# Patient Record
Sex: Female | Born: 1990 | Race: White | Hispanic: No | Marital: Single | Smoking: Never smoker
Health system: Southern US, Community
[De-identification: ages and names within clinical notes are randomized; demographics above are authoritative.]

---

## 2010-11-11 ENCOUNTER — Ambulatory Visit: Payer: Self-pay | Admitting: Family

## 2010-11-18 ENCOUNTER — Ambulatory Visit: Payer: Self-pay | Admitting: Family

## 2010-11-18 ENCOUNTER — Other Ambulatory Visit
Admission: RE | Admit: 2010-11-18 | Discharge: 2010-11-18 | Payer: Self-pay | Source: Home / Self Care | Admitting: Internal Medicine

## 2010-11-18 ENCOUNTER — Ambulatory Visit (HOSPITAL_BASED_OUTPATIENT_CLINIC_OR_DEPARTMENT_OTHER)
Admission: RE | Admit: 2010-11-18 | Discharge: 2010-11-18 | Payer: Self-pay | Source: Home / Self Care | Attending: Internal Medicine | Admitting: Internal Medicine

## 2010-11-18 ENCOUNTER — Encounter: Payer: Self-pay | Admitting: Family

## 2010-11-18 DIAGNOSIS — R112 Nausea with vomiting, unspecified: Secondary | ICD-10-CM

## 2010-11-18 DIAGNOSIS — K219 Gastro-esophageal reflux disease without esophagitis: Secondary | ICD-10-CM

## 2010-11-18 LAB — CONVERTED CEMR LAB
ALT: 14 units/L (ref 0–35)
AST: 14 units/L (ref 0–37)
BUN: 12 mg/dL (ref 6–23)
Bilirubin, Direct: 0.1 mg/dL (ref 0.0–0.3)
Calcium: 9.7 mg/dL (ref 8.4–10.5)
Chloride: 105 meq/L (ref 96–112)
Hemoglobin: 14.1 g/dL (ref 12.0–15.0)
Indirect Bilirubin: 0.6 mg/dL (ref 0.0–0.9)
Lymphocytes Relative: 37 % (ref 12–46)
Lymphs Abs: 1.6 10*3/uL (ref 0.7–4.0)
MCHC: 33.1 g/dL (ref 30.0–36.0)
Monocytes Absolute: 0.5 10*3/uL (ref 0.1–1.0)
Neutro Abs: 2.1 10*3/uL (ref 1.7–7.7)
Platelets: 241 10*3/uL (ref 150–400)
RBC: 4.52 M/uL (ref 3.87–5.11)
RDW: 12.4 % (ref 11.5–15.5)
TSH: 0.559 microintl units/mL (ref 0.350–4.500)
Total Bilirubin: 0.7 mg/dL (ref 0.3–1.2)
Total Protein: 7.1 g/dL (ref 6.0–8.3)

## 2010-11-19 ENCOUNTER — Encounter: Payer: Self-pay | Admitting: Family

## 2010-11-21 ENCOUNTER — Encounter: Payer: Self-pay | Admitting: Family

## 2010-12-11 ENCOUNTER — Telehealth: Payer: Self-pay | Admitting: Family

## 2010-12-25 NOTE — Assessment & Plan Note (Signed)
Summary: new to est uhc/mhf--Rm 5   Vital Signs:  Patient profile:   20 year old female Menstrual status:  regular LMP:     11/11/2010 Height:      66.5 inches Weight:      174 pounds BMI:     27.76 Temp:     98.0 degrees F oral Pulse rate:   72 / minute Pulse rhythm:   regular Resp:     16 per minute BP sitting:   122 / 86  (right arm) Cuff size:   large  Vitals Entered By: Mervin Kung CMA Duncan Dull) (November 11, 2010 8:26 AM) CC: New pt to establish care. Needs refill on Sprintec. Is Patient Diabetic? No Pain Assessment Patient in pain? no      LMP (date): 11/11/2010     Menstrual Status regular Enter LMP: 11/11/2010   CC:  New pt to establish care. Needs refill on Sprintec.Marland Kitchen  History of Present Illness: Pt.  has been following with [facility] in [place].    Has not had a pap smear.  Jogs several times a week.  Diet-  fair.  Doing own.    Preventive Screening-Counseling & Management  Alcohol-Tobacco     Alcohol drinks/day: <1     Smoking Status: never  Caffeine-Diet-Exercise     Caffeine use/day: 1 daily     Does Patient Exercise: yes     Type of exercise: jogging     Times/week: 2  Allergies (verified): 1)  ! Sulfa  Past History:  Past Medical History: none  Past Surgical History: none  Family History: mother-- living, ? arrythmia father-- living, diabetes2 1 sister-- lactose intolerant No children.  Social History: Occupation: Consulting civil engineer -  [employer] 1 sexual partner last 6 months Single Never Smoked Alcohol use-yes (2-3 drinks a week) Denies drug use Regular exercise-yes Smoking Status:  never Caffeine use/day:  1 daily Does Patient Exercise:  yes  Review of Systems       Constitutional: Denies Fever ENT:  Denies nasal congestion or sore throat. Resp: Denies cough CV:  Denies Chest Pain GI:  Denies nausea or vomitting GU: Denies dysuria Lymphatic: Denies lymphadenopathy Musculoskeletal:  Denies muscle/joint  pain Skin:  Denies Rashes Psychiatric: Denies depression Neuro: Denies numbness     Physical Exam  General:  Well-developed,well-nourished,in no acute distress; alert,appropriate and cooperative throughout examination Head:  Normocephalic and atraumatic without obvious abnormalities. No apparent alopecia or balding. Eyes:  PERRLA Ears:  External ear exam shows no significant lesions or deformities.  Otoscopic examination reveals clear canals, tympanic membranes are intact bilaterally without bulging, retraction, inflammation or discharge. Hearing is grossly normal bilaterally. Mouth:  Oral mucosa and oropharynx without lesions or exudates.  Teeth in good repair. Neck:  No deformities, masses, or tenderness noted. Breasts:  deferred Lungs:  Normal respiratory effort, chest expands symmetrically. Lungs are clear to auscultation, no crackles or wheezes. Heart:  Normal rate and regular rhythm. S1 and S2 normal without gallop, murmur, click, rub or other extra sounds. Abdomen:  Bowel sounds positive,abdomen soft and non-tender without masses, organomegaly or hernias noted. + "belly button" ring Genitalia:  deferred Msk:  No deformity or scoliosis noted of thoracic or lumbar spine.   Pulses:  2+ bilateral radial, DP/PT pulses Extremities:  No clubbing, cyanosis, edema, or deformity noted with normal full range of motion of all joints.   Neurologic:  No cranial nerve deficits noted. Station and gait are normal. Plantar reflexes are down-going bilaterally. DTRs are symmetrical  throughout. Sensory, motor and coordinative functions appear intact. Skin:  Intact without suspicious lesions or rashes Cervical Nodes:  No lymphadenopathy noted Psych:  Cognition and judgment appear intact. Alert and cooperative with normal attention span and concentration. No apparent delusions, illusions, hallucinations   Impression & Recommendations:  Problem # 1:  Preventive Health Care (ICD-V70.0) Assessment  Comment Only Patient counseled on diet, exercise, weight loss and safe sex.  + menses today, pt to f/u in January for pap smear. Wishes to discuss immunizations with her mother.  We will plan to give at f/u visit.   Complete Medication List: 1)  Sprintec 28 0.25-35 Mg-mcg Tabs (Norgestimate-eth estradiol) .... Take 1 tablet by mouth once a day. 2)  Ibuprofen 600 Mg Tabs (Ibuprofen) .... Take as needed for menstrual pain.  Patient Instructions: 1)  Please schedule a follow up appointment in January for a Pap smear. 2)  Welcome to [employer], Have a nice Holiday! Prescriptions: SPRINTEC 28 0.25-35 MG-MCG TABS (NORGESTIMATE-ETH ESTRADIOL) Take 1 tablet by mouth once a day.  #1 x 0   Entered and Authorized by:   Lemont Fillers FNP   Signed by:   Lemont Fillers FNP on 11/11/2010   Method used:   [facility]  [address] [phone][facility]       [address]       [place], [place]  [address]       Ph: [phone] or [phone]       Fax: [phone]   RxID:   [id number]    Orders Added: 1)  Est. Patient 18-39 years [23557]     Preventive Care Screening     Never had pap smear. Doesn't remember last tetanus. Nicki Guadalajara Fergerson CMA Duncan Dull)  November 11, 2010 8:39 AM  d  Current Allergies (reviewed today): ! SULFA   Appended Document: new to est uhc/mhf--Rm 5 Append to HPI:  Pt reports that she was followed by a practice in [place] (? Family practice).  She wishes to establish here since it is more convenient for her.  She has never had a pap smear,  + regular exercise (jogging).  Diet is fair- she recently moved in to an appartment and is now doing her own cooking.  Frequently eats packaged prepared foods such as "easy Mac."  Has not had a flu shot this year.

## 2010-12-25 NOTE — Assessment & Plan Note (Signed)
Summary: pap/mhf--Rm 5   Vital Signs:  Patient profile:   20 year old female Menstrual status:  regular LMP:     11/11/2010 Height:      66.5 inches Weight:      172.50 pounds BMI:     27.52 Temp:     98.7 degrees F oral Pulse rate:   78 / minute Pulse rhythm:   regular Resp:     16 per minute BP sitting:   120 / 88  (left arm) Cuff size:   large  Vitals Entered By: Mervin Kung CMA Duncan Dull) (November 18, 2010 9:02 AM) Comments Pt states she has had intermittent nausea and vomiting in the morning for a few years. Had episode this morning. Also states she cannot tolerate the heat. Nicki Guadalajara Fergerson CMA Duncan Dull)  November 18, 2010 9:10 AM  LMP (date): 11/11/2010     Enter LMP: 11/11/2010   Primary Care Provider:  Lemont Fillers FNP   History of Present Illness: Ms.  Hand is a 20 year old female who presents today for an initial Pap smear.  This was postponed last visit due to menses.    She also presents today with cheif complaint of nausea and vomitting. Symptoms have been present "since high school" (3-4 years).  Symptoms are intermittent in nature.  Symptoms are associated with "acid taste" in mouth, but not assoiated with hematemesis.  Symptoms are not improved by eating, nor are they worsened by eating.   She notes associated "acid taste."  Happened this AM.  Vomitted.  Denies coffee ground emesis.  Notes + phlegm and stomach acid.  Also notes some RUQ tenderness x 1 week- "under my rib cage"  Symptoms started before she began sprintec.  Allergies: 1)  ! Sulfa  Past History:  Past Medical History: Last updated: 11/11/2010 none  Past Surgical History: Last updated: 11/11/2010 none  Family History: Last updated: 11/18/2010 mother-- living,  atrial fibrillation. father-- living, diabetes2 1 sister-- lactose intolerant No children.  Social History: Last updated: 11/11/2010 Occupation: student -  [employer] 1 sexual partner last 6  months Single Never Smoked Alcohol use-yes (2-3 drinks a week) Denies drug use Regular exercise-yes  Family History: mother-- living,  atrial fibrillation. father-- living, diabetes2 1 sister-- lactose intolerant No children.  Review of Systems       see HPI  Physical Exam  General:  Well-developed,well-nourished,in no acute distress; alert,appropriate and cooperative throughout examination Head:  Normocephalic and atraumatic without obvious abnormalities. No apparent alopecia or balding. Breasts:  No mass, nodules, thickening, tenderness, bulging, retraction, inflamation, nipple discharge or skin changes noted.   Lungs:  Normal respiratory effort, chest expands symmetrically. Lungs are clear to auscultation, no crackles or wheezes. Heart:  Normal rate and regular rhythm. S1 and S2 normal without gallop, murmur, click, rub or other extra sounds. Abdomen:  soft, normal bowel sounds, no distention, and no masses.  Mild RUQ tenderness to palpation without guarding. Genitalia:  Pelvic Exam:        External: normal female genitalia without lesions or masses        Vagina: normal without lesions or masses        Cervix: normal without lesions or masses        Adnexa: normal bimanual exam without masses or fullness        Uterus: normal by palpation        Pap smear: performed Psych:  Cognition and judgment appear intact. Alert and cooperative with normal  attention span and concentration. No apparent delusions, illusions, hallucinations   Impression & Recommendations:  Problem # 1:  ROUTINE GYNECOLOGICAL EXAMINATION (ICD-V72.31) Assessment Comment Only Pap smear performed today.  Will also check GC/Chlamydia.  BSE was taught to patient.  Flu shot today.  Mother will call us with the information on patient's last tetanus shot.  Flu shot today.  Problem # 2:  NAUSEA AND VOMITING (ICD-787.01) Assessment: Deteriorated Will check baseline laboratories and an abdominal ultrasound to  evaluate for cholelithiasis/cholecystitis.  Will also add once daily OTC prilosec for probably GERD symptoms.   Orders: T-Amylase [phone]) T-Lipase [id number]) Misc. Referral (Misc. Ref)  Complete Medication List: 1)  Sprintec 28 0.25-35 Mg-mcg Tabs (Norgestimate-eth estradiol) .... Take 1 tablet by mouth once a day. 2)  Ibuprofen 600 Mg Tabs (Ibuprofen) .... Take as needed for menstrual pain. 3)  Prilosec Otc 20 Mg Tbec (Omeprazole magnesium) .... One tablet by mouth once daily  Other Orders: TLB-BMP (Basic Metabolic Panel-BMET) (80048-METABOL) TLB-CBC Platelet - w/Differential (85025-CBCD) TLB-Hepatic/Liver Function Pnl (80076-HEPATIC) TLB-TSH (Thyroid Stimulating Hormone) (46962-XBM)  Patient Instructions: 1)  Please complete your lab work and ultrasound downstairs.   2)  Call if your symptoms worsen or do not improve. 3)  Try to limit your sodium. 4)  We will mail you the results of your pap smear and lab work.    Orders Added: 1)  TLB-BMP (Basic Metabolic Panel-BMET) [80048-METABOL] 2)  TLB-CBC Platelet - w/Differential [85025-CBCD] 3)  TLB-Hepatic/Liver Function Pnl [80076-HEPATIC] 4)  TLB-TSH (Thyroid Stimulating Hormone) [84443-TSH] 5)  T-Amylase [82150-23210] 6)  T-Lipase [83690-23215] 7)  Misc. Referral [Misc. Ref] 8)  Est. Patient Level IV [84132]    Current Allergies (reviewed today): ! SULFA Updated/Current Medications (including changes made in today's visit):  SPRINTEC 28 0.25-35 MG-MCG TABS (NORGESTIMATE-ETH ESTRADIOL) Take 1 tablet by mouth once a day. IBUPROFEN 600 MG TABS (IBUPROFEN) Take as needed for menstrual pain. PRILOSEC OTC 20 MG TBEC (OMEPRAZOLE MAGNESIUM) one tablet by mouth once daily    Vital Signs:  Patient Profile:   20 year old female LMP:     11/11/2010 Height:     66.5 inches Weight:      172.50 pounds BMI:     27.52 Temp:     98.7 degrees F oral Pulse rate:   78 / minute Pulse rhythm:   regular Resp:     16 per  minute BP sitting:   120 / 88 Cuff size:   large                 Appended Document: pap/mhf--Rm 5    Clinical Lists Changes  Orders: Added new Service order of Specimen Handling (44010) - Signed Added new Service order of Admin 1st Vaccine (27253) - Signed Added new Service order of Flu Vaccine 65yrs + (66440) - Signed Observations: Added new observation of FLU VAX VIS: 06/17/10 version (11/18/2010 9:54) Added new observation of FLU VAXLOT: [id number] (11/18/2010 9:54) Added new observation of FLU VAXMFR: Glaxosmithkline (11/18/2010 9:54) Added new observation of FLU VAX EXP: 05/23/2011 (11/18/2010 9:54) Added new observation of FLU VAX DSE: 0.67ml (11/18/2010 9:54) Added new observation of FLU VAX: Fluvax 3+ (11/18/2010 9:54)      Flu Vaccine Consent Questions     Do you have a history of severe allergic reactions to this vaccine? no    Any prior history of allergic reactions to egg and/or gelatin? no    Do you have a sensitivity to the preservative Thimersol? no  Do you have a past history of Guillan-Barre Syndrome? no    Do you currently have an acute febrile illness? no    Have you ever had a severe reaction to latex? no    Vaccine information given and explained to patient? yes    Are you currently pregnant? no    Lot Number:[id number]   Exp Date:05/23/2011   Site Given  Left Deltoid IM.  Nicki Guadalajara Fergerson CMA Duncan Dull)  November 18, 2010 9:56 AM

## 2010-12-25 NOTE — Progress Notes (Signed)
Summary: REFILL BIRTH CONTROL   Phone Note Refill Request Message from:  Patient on December 11, 2010 3:08 PM  Refills Requested: Medication #1:  SPRINTEC 28 0.25-35 MG-MCG TABS Take 1 tablet by mouth once a day.   Dosage confirmed as above?Dosage Confirmed   Brand Name Necessary? No   Supply Requested: 1 month   Last Refilled: 11/11/2010 sHE NEEDS A REFILL AND NEEDS RX FOR 12 MONTHS.  [facility] [place] [phone]   Method Requested: Telephone to Pharmacy Next Appointment Scheduled: NONE Initial call taken by: Roselle Locus,  December 11, 2010 3:08 PM  Follow-up for Phone Call        Called to number above.  [facility] not in our system. Follow-up by: Lemont Fillers FNP,  December 12, 2010 11:03 AM    Prescriptions: SPRINTEC 28 0.25-35 MG-MCG TABS (NORGESTIMATE-ETH ESTRADIOL) Take 1 tablet by mouth once a day.  #1 x 10   Entered and Authorized by:   Lemont Fillers FNP   Signed by:   Lemont Fillers FNP on 12/12/2010   Method used:   Telephoned [facility] ...       [facility]             , [place]         Ph:        Fax:    RxID:   [id number]

## 2010-12-25 NOTE — Letter (Signed)
   [place] at [facility] [address] [place], [place]  [address]  [place] Phone: [phone]      November 21, 2010   Jennifer Mata [address] [place], [place] [address]  RE:  LAB RESULTS  Dear  Ms. Sires,  The following is an interpretation of your most recent lab tests.  Please take note of any instructions provided or changes to medications that have resulted from your lab work.  Pap Smear: normal - follow up in 1 year    Sincerely Yours,    Lemont Fillers FNP  Appended Document:  Mailed.

## 2010-12-25 NOTE — Letter (Signed)
   [place] at [facility] [address] [place], [place]  [address]  [place] Phone: [phone]      November 19, 2010   Jennifer Mata [address] [place], [place] [address]  RE:  LAB RESULTS  Dear  Ms. Sedgwick,  The following is an interpretation of your most recent lab tests.  Please take note of any instructions provided or changes to medications that have resulted from your lab work.  ELECTROLYTES:  Good - no changes needed  KIDNEY FUNCTION TESTS:  Good - no changes needed  LIVER FUNCTION TESTS:  Good - no changes needed  THYROID STUDIES:  Thyroid studies normal TSH: 0.559     CBC:  Good - no changes needed Your sugar is at the upper limits of normal.  We will keep an eye on this.   Otherwise all of your labs look good.     Sincerely Yours,    Lemont Fillers FNP  Appended Document:  Mailed.

## 2011-07-20 ENCOUNTER — Telehealth: Payer: Self-pay | Admitting: *Deleted

## 2011-07-20 MED ORDER — NORETHINDRONE ACET-ETHINYL EST 1-20 MG-MCG PO TABS: 1.0000 | ORAL_TABLET | Freq: Every day | ORAL | Status: DC

## 2011-07-20 NOTE — Telephone Encounter (Signed)
She can try microgestin which has a lower estrogen content.  However, all birth control pills have the potential for weight gain.  How much weight has she gained?

## 2011-07-20 NOTE — Telephone Encounter (Signed)
Notified pt's mother and was [facility] [place]. Rx sent electronically. Barth Kirks will call back with amount of weight pt has gained.

## 2011-07-20 NOTE — Telephone Encounter (Signed)
Pt's mother called to let us know that pt is stopping Sprintec due to weight gain. She would like a low dose alternative that will not cause her to gain weight. Pt's mother request that pt not be put on Yaz or any "new market" medication. Pt is currently away at college. Please advise.

## 2011-07-21 NOTE — Telephone Encounter (Signed)
Patients mother [name] returned phone call and left voice message stating patient has gained  About 10-15 pounds. Her message states patient has been off of the pills for several months and has lost the weight. She is requesting [facility] [place], [place]

## 2011-07-22 ENCOUNTER — Telehealth: Payer: Self-pay | Admitting: *Deleted

## 2011-07-22 NOTE — Telephone Encounter (Signed)
[facility] [place] stating the generics are on manufacturer back order and he is currently able to get the Junel that has 7 iron tablets in the pack. He states he could cut the iron tablets off and only give the pt the Junel.  Advised him per Sandford Craze, NP to hold off at this time and we will get back with him. Please advise.

## 2011-07-22 NOTE — Telephone Encounter (Signed)
Spoke with pharmacist- ok to order Junel 28 pack.

## 2011-08-28 ENCOUNTER — Encounter: Payer: Self-pay | Admitting: Family

## 2011-08-28 ENCOUNTER — Ambulatory Visit (INDEPENDENT_AMBULATORY_CARE_PROVIDER_SITE_OTHER): Payer: 59 | Admitting: Family

## 2011-08-28 ENCOUNTER — Ambulatory Visit: Payer: Self-pay | Admitting: Family

## 2011-08-28 VITALS — BP 110/78 | HR 90 | Temp 97.8°F | Resp 16 | Wt 145.0 lb

## 2011-08-28 DIAGNOSIS — R197 Diarrhea, unspecified: Secondary | ICD-10-CM

## 2011-08-28 DIAGNOSIS — R109 Unspecified abdominal pain: Secondary | ICD-10-CM | POA: Insufficient documentation

## 2011-08-28 LAB — HEPATIC FUNCTION PANEL
ALT: 11 U/L (ref 0–35)
AST: 10 U/L (ref 0–37)
Albumin: 4.9 g/dL (ref 3.5–5.2)

## 2011-08-28 MED ORDER — ESOMEPRAZOLE MAGNESIUM 40 MG PO PACK: 40.0000 mg | PACK | Freq: Every day | ORAL | Status: DC

## 2011-08-28 NOTE — Patient Instructions (Signed)
You will be contacted about your referral to Gastroenterology. Please start nexium. Follow up in 3 months- sooner if symptoms worsen or if they do not improve.

## 2011-08-28 NOTE — Assessment & Plan Note (Signed)
Pt with multiple GI complaints.  This has been going on for several years.  Will repeat lab work and refer to GI for formal evaluation to include evaluation for celiac disease.  Start nexium, continue gluten free diet for now.

## 2011-08-28 NOTE — Progress Notes (Signed)
  Subjective:    Patient ID: Jennifer Mata, female    DOB: Apr 14, 1991, 20 y.o.   MRN: [id number]  HPI  Ms.  Mata is a 20 yr old female who presents today with chief complaint of intermittent abdominal pain. Pain is located in the right upper quadrant radiates around the right side. Pain has been present "since high school" and is worseing. Same symptoms she had back in December.  Had a neg abdominal ultrasound at that time and was placed on prilosec otc without improvement. Now on a gluten free diet and trying to avoid dairy.  She notes some improvement in her symptoms, especially in the AM.  Symptoms are worst in the AM's and at bedtime.  + diarrhea- 2 stools a day soft/watery. No black/bloody stools.  Vomitting nearly every morning.  Last menstual period was 2 weeks ago. She is requesting a referral to a GI in [place]     Review of Systems See HPI  No past medical history on file.  History   Social History  . Marital Status: Single    Spouse Name: N/A    Number of Children: N/A  . Years of Education: N/A   Occupational History  . Not on file.   Social History Main Topics  . Smoking status: Never Smoker   . Smokeless tobacco: Never Used  . Alcohol Use: Not on file  . Drug Use: Not on file  . Sexually Active: Not on file   Other Topics Concern  . Not on file   Social History Narrative  . No narrative on file    No past surgical history on file.  No family history on file.  Allergies  Allergen Reactions  . Sulfonamide Derivatives     REACTION: unknown reaction    No current outpatient prescriptions on file prior to visit.    BP 110/78  Pulse 90  Temp(Src) 97.8 F (36.6 C) (Oral)  Resp 16  Wt 145 lb (65.772 kg)       Objective:   Physical Exam  Constitutional: She appears well-developed and well-nourished.  Cardiovascular: Normal rate and regular rhythm.   No murmur heard. Pulmonary/Chest: Effort normal and breath sounds normal. No respiratory  distress. She has no wheezes. She has no rales. She exhibits no tenderness.  Abdominal: Soft. Bowel sounds are normal.  Psychiatric: She has a normal mood and affect. Her behavior is normal. Judgment and thought content normal.          Assessment & Plan:

## 2011-08-29 LAB — BASIC METABOLIC PANEL WITH GFR
CO2: 25 mEq/L (ref 19–32)
Calcium: 10.1 mg/dL (ref 8.4–10.5)
Chloride: 103 mEq/L (ref 96–112)
Creat: 0.87 mg/dL (ref 0.50–1.10)
GFR, Est African American: >60 mL/min (ref >60)
Glucose, Bld: 85 mg/dL (ref 70–99)
Sodium: 140 mEq/L (ref 135–145)

## 2011-08-29 LAB — CBC
MCH: 32.5 pg (ref 26.0–34.0)
Platelets: 228 10*3/uL (ref 150–400)
RDW: 12.4 % (ref 11.5–15.5)
WBC: 6.5 10*3/uL (ref 4.0–10.5)

## 2011-08-31 ENCOUNTER — Telehealth: Payer: Self-pay | Admitting: Family

## 2011-08-31 ENCOUNTER — Encounter: Payer: Self-pay | Admitting: Family

## 2011-08-31 DIAGNOSIS — R110 Nausea: Secondary | ICD-10-CM

## 2011-08-31 NOTE — Telephone Encounter (Signed)
Patients mom [name], would still like her daughter to be referred to a specialist. But, would Jennifer Mata please order a celiac test for patient. [name] states that is the reason why daughter drove 4 hours to see her on Friday. Patient will be going downstairs to [place] if labs are ordered.

## 2011-08-31 NOTE — Telephone Encounter (Signed)
Notified pt's mom and she will verify how long pt will be here from college. Awaiting return call before order is sent to the lab.

## 2011-08-31 NOTE — Telephone Encounter (Signed)
Please return call and let mom/patient know that I am happy to order the celiac panel, but that it will not be accurate while pt is on a gluten-free diet. She shoule resume a regular gluten rich diet for 2 weeks before completing the test.  See order.

## 2011-08-31 NOTE — Telephone Encounter (Signed)
Please advise 

## 2011-09-03 NOTE — Telephone Encounter (Signed)
Spoke to pt's mother, she states pt is returning to school on Monday at [employer] and will have labs ordered by Dr Loel Dubonnet in [place]. Celiac order cancelled for [facility]. Dr Caralyn Guile phone number was given to Terri ([phone]).

## 2012-02-12 ENCOUNTER — Telehealth: Payer: Self-pay | Admitting: *Deleted

## 2012-02-12 DIAGNOSIS — E0590 Thyrotoxicosis, unspecified without thyrotoxic crisis or storm: Secondary | ICD-10-CM

## 2012-02-12 NOTE — Telephone Encounter (Signed)
I would like to see her in the office that week as well.  I work on endo referral.

## 2012-02-12 NOTE — Telephone Encounter (Signed)
Notified pt's mom, Aurther Loft. She requests that we call her on her work number [phone] ([facility]) or cell# listed above. She will schedule pt's appt with Melissa when we call her with endo appt.

## 2012-02-12 NOTE — Telephone Encounter (Signed)
Pt's mother called to let us know that pt has been diagnosed with hyperthyroidism and elevated cortisol level. Pt's mother states that the GI doctor in [place] suggested that she an endocrinologist. Pt will be home for spring break 02/16/12 through 02/19/12. They are wanting referral to an endocrinologist during this time. She states that her daughter is having a very difficult time. Has hot flashes, hair is coming out, has lost 35lbs, gets stressed and angry very easily. Advised pt's mother that I did not see that we have received GI records from [facility] yet, she will call them to fax info to [place] but wants [place] to get started with Endo referral. Please advise.

## 2012-02-16 ENCOUNTER — Encounter: Payer: Self-pay | Admitting: Family

## 2012-02-16 ENCOUNTER — Ambulatory Visit (INDEPENDENT_AMBULATORY_CARE_PROVIDER_SITE_OTHER): Payer: 59 | Admitting: Family

## 2012-02-16 VITALS — BP 132/86 | HR 66 | Temp 98.2°F | Resp 16 | Wt 143.1 lb

## 2012-02-16 DIAGNOSIS — R7989 Other specified abnormal findings of blood chemistry: Secondary | ICD-10-CM

## 2012-02-16 DIAGNOSIS — E270 Other adrenocortical overactivity: Secondary | ICD-10-CM

## 2012-02-16 DIAGNOSIS — L659 Nonscarring hair loss, unspecified: Secondary | ICD-10-CM

## 2012-02-16 DIAGNOSIS — E0590 Thyrotoxicosis, unspecified without thyrotoxic crisis or storm: Secondary | ICD-10-CM

## 2012-02-16 NOTE — Progress Notes (Signed)
Subjective:    Patient ID: Jennifer Mata, female    DOB: 05-11-1991, 21 y.o.   MRN: [id number]  HPI  Ms. Jennifer Mata is a 21 yr old female who presents today for follow up.  Last visit she was having GI complaints- abdominal pain and diarrhea, and was referred to GI near her college in [place].  She underwent EGD which was unremarkable except for small hiatal hernia, and colo which was incomplete, but area examined was normal.  Duodenal biopsy and gastric biopsy were unremarkable.  Tissue transglutaminase was normal.  She tells me that she still feels that she has a wheat sensitivity and notes that minimizing wheat in her diet has helped her symptoms considerably.   At this time, she reports that diarrhea has resolved, but she reports more frequent bms (2-3) a day. GI did obtain TFTS which revealed borderline hyperthyroidism with a TSH of 0.448 and normal free T3/T4. They also obtained serum cortisol levels were elevated on two occasions.  Cortisol was 38.4 on 02/10/12 (range 6.2-19.4) and 26.7 on 01/28/12.  Records have been reviewed.  She reports that she has had continued weight loss.  Exercising regularly, but not actively trying to lose weight.  Pt reports increased anxiety, extreme mood swings x 1 month. Having night sweats and dizziness when standing.  Also notes that her hair has become very fine and has been having breakage of her hair.   Review of Systems See HPI  No past medical history on file.  History   Social History  . Marital Status: Single    Spouse Name: N/A    Number of Children: N/A  . Years of Education: N/A   Occupational History  . Not on file.   Social History Main Topics  . Smoking status: Never Smoker   . Smokeless tobacco: Never Used  . Alcohol Use: Not on file  . Drug Use: Not on file  . Sexually Active: Not on file   Other Topics Concern  . Not on file   Social History Narrative  . No narrative on file    No past surgical history on file.  No family  history on file.  Allergies  Allergen Reactions  . Sulfonamide Derivatives     REACTION: unknown reaction    Current Outpatient Prescriptions on File Prior to Visit  Medication Sig Dispense Refill  . JUNEL FE 1/20 1-20 MG-MCG tablet Take 1 tablet by mouth daily.      Marland Kitchen esomeprazole (NEXIUM) 40 MG packet Take 40 mg by mouth daily before breakfast.  30 each  3    BP 132/86  Pulse 66  Temp(Src) 98.2 F (36.8 C) (Oral)  Resp 16  Wt 143 lb 1.3 oz (64.901 kg)  SpO2 99%  LMP 01/26/2012       Objective:   Physical Exam  Constitutional: She is oriented to person, place, and time. She appears well-developed and well-nourished. No distress.  HENT:       No exophthalmos noted.   Neck: No thyromegaly present.  Cardiovascular: Normal rate and regular rhythm.   No murmur heard. Pulmonary/Chest: Effort normal and breath sounds normal.  Abdominal: Soft. Bowel sounds are normal. She exhibits no distension. There is no tenderness.  Musculoskeletal: She exhibits no edema.  Lymphadenopathy:    She has no cervical adenopathy.  Neurological: She is alert and oriented to person, place, and time.  Skin: Skin is warm and dry.  Psychiatric: She has a normal mood and affect. Her behavior is  normal. Judgment and thought content normal.          Assessment & Plan:

## 2012-02-16 NOTE — Patient Instructions (Signed)
Please keep your upcoming appointment with Endocrinology. Follow up here in May after school is out.

## 2012-02-17 ENCOUNTER — Encounter: Payer: Self-pay | Admitting: Family

## 2012-02-17 DIAGNOSIS — E0590 Thyrotoxicosis, unspecified without thyrotoxic crisis or storm: Secondary | ICD-10-CM | POA: Insufficient documentation

## 2012-02-17 DIAGNOSIS — R7989 Other specified abnormal findings of blood chemistry: Secondary | ICD-10-CM | POA: Insufficient documentation

## 2012-02-17 DIAGNOSIS — L659 Nonscarring hair loss, unspecified: Secondary | ICD-10-CM | POA: Insufficient documentation

## 2012-02-17 LAB — CBC WITH DIFFERENTIAL/PLATELET
Eosinophils Absolute: 0.1 10*3/uL (ref 0.0–0.7)
Eosinophils Relative: 2 % (ref 0–5)
HCT: 40.9 % (ref 36.0–46.0)
Hemoglobin: 13.8 g/dL (ref 12.0–15.0)
MCH: 32.1 pg (ref 26.0–34.0)
Neutro Abs: 3.1 10*3/uL (ref 1.7–7.7)
Neutrophils Relative %: 56 % (ref 43–77)
Platelets: 217 10*3/uL (ref 150–400)
RDW: 12.7 % (ref 11.5–15.5)
WBC: 5.5 10*3/uL (ref 4.0–10.5)

## 2012-02-17 NOTE — Assessment & Plan Note (Signed)
Borderline, she has been referred to [facility] who will be evaluating her this week.

## 2012-02-17 NOTE — Assessment & Plan Note (Signed)
?   Related to Hyperthyroidism.  Differential also includes: adrenal adenoma and cushings disease.  Defer further work up to endocrinology. She will see them this week.  GI records have been forwarded with referral.

## 2012-02-17 NOTE — Assessment & Plan Note (Signed)
Likely related to hyperthyroid. CBC is performed to exclude underlying anemia and is normal.

## 2012-04-11 ENCOUNTER — Encounter: Payer: Self-pay | Admitting: Internal Medicine

## 2012-04-11 ENCOUNTER — Ambulatory Visit (INDEPENDENT_AMBULATORY_CARE_PROVIDER_SITE_OTHER): Payer: 59 | Admitting: Internal Medicine

## 2012-04-11 VITALS — BP 120/80 | HR 75 | Temp 97.5°F | Resp 18 | Ht 67.0 in | Wt 142.0 lb

## 2012-04-11 DIAGNOSIS — F419 Anxiety disorder, unspecified: Secondary | ICD-10-CM

## 2012-04-11 DIAGNOSIS — F411 Generalized anxiety disorder: Secondary | ICD-10-CM

## 2012-04-11 HISTORY — PX: NO PAST SURGERIES: SHX2092

## 2012-04-11 MED ORDER — PAROXETINE HCL 20 MG PO TABS: 20.0000 mg | ORAL_TABLET | ORAL | Status: DC

## 2012-04-11 NOTE — Assessment & Plan Note (Signed)
Begin paxil 20mg  po qd. Schedule f/u in 4wks or sooner if needed.

## 2012-04-11 NOTE — Progress Notes (Signed)
  Subjective:    Patient ID: Jennifer Mata, female    DOB: Apr 03, 1991, 21 y.o.   MRN: [id number]  HPI Pt presents to clinic for evaluation of anxiety. Notes chronic daily anxiety as well as intermittent possible panic attacks. Sometimes is awakened at night with sx's. No recent stressors as daily triggers but does note sx's began last year after the passing of two people. Did speak with a counselor at that time. Has never attempted medication for the problem but is interested in doing so. Also has college immunization form to be completed. No available record of immunizations but believes can be obtained from previous clinic. Total time of visit ~26 minutes of which >50% spent in counseling.   No past medical history on file. Past Surgical History  Procedure Date  . No past surgeries 04/11/2012    reports that she has never smoked. She has never used smokeless tobacco. She reports that she drinks alcohol. She reports that she does not use illicit drugs. family history includes Diabetes in her father.  There is no history of Breast cancer, and Prostate cancer, and Heart disease, and Colon cancer, and Hypertension, . Allergies  Allergen Reactions  . Sulfonamide Derivatives     REACTION: unknown reaction     Review of Systems see hpi     Objective:   Physical Exam  Nursing note and vitals reviewed. Constitutional: She appears well-developed and well-nourished. No distress.  HENT:  Head: Normocephalic and atraumatic.  Eyes: Conjunctivae are normal. No scleral icterus.  Neurological: She is alert.  Skin: She is not diaphoretic.  Psychiatric: She has a normal mood and affect. Her behavior is normal.          Assessment & Plan:

## 2012-04-21 ENCOUNTER — Telehealth: Payer: Self-pay | Admitting: Internal Medicine

## 2012-04-21 NOTE — Telephone Encounter (Signed)
Patients mom [name] called, saying that patients feels like she is having a side effect to paxil. Fast heart rate and muscles hurting. Terri said that patient did not take a pill today.

## 2012-04-21 NOTE — Telephone Encounter (Signed)
Agree with holding med. If sx's resolve then can always try 1/2 tablet

## 2012-04-21 NOTE — Telephone Encounter (Signed)
Call placed to patient mother at  [phone], no answer.  A detailed voice message was left informing patients mother per Dr Rodena Medin instructions. A voice message was left for patient to call back if no improvement.

## 2012-05-02 ENCOUNTER — Ambulatory Visit (INDEPENDENT_AMBULATORY_CARE_PROVIDER_SITE_OTHER): Payer: 59 | Admitting: Internal Medicine

## 2012-05-02 ENCOUNTER — Ambulatory Visit (HOSPITAL_BASED_OUTPATIENT_CLINIC_OR_DEPARTMENT_OTHER)
Admission: RE | Admit: 2012-05-02 | Discharge: 2012-05-02 | Disposition: A | Discharge status: Home / Self Care | Payer: 59 | Source: Ambulatory Visit | Attending: Internal Medicine | Admitting: Internal Medicine

## 2012-05-02 ENCOUNTER — Encounter: Payer: Self-pay | Admitting: Internal Medicine

## 2012-05-02 VITALS — BP 100/70 | HR 83 | Temp 98.3°F | Resp 18 | Ht 67.0 in | Wt 136.0 lb

## 2012-05-02 DIAGNOSIS — R109 Unspecified abdominal pain: Secondary | ICD-10-CM

## 2012-05-02 DIAGNOSIS — R1011 Right upper quadrant pain: Secondary | ICD-10-CM | POA: Insufficient documentation

## 2012-05-02 DIAGNOSIS — F419 Anxiety disorder, unspecified: Secondary | ICD-10-CM

## 2012-05-02 DIAGNOSIS — F411 Generalized anxiety disorder: Secondary | ICD-10-CM

## 2012-05-02 DIAGNOSIS — R110 Nausea: Secondary | ICD-10-CM | POA: Insufficient documentation

## 2012-05-02 MED ORDER — ESCITALOPRAM OXALATE 5 MG PO TABS: 5.0000 mg | ORAL_TABLET | Freq: Every day | ORAL | Status: AC

## 2012-05-02 MED ORDER — IOHEXOL 300 MG/ML  SOLN
100.0000 mL | Freq: Once | INTRAMUSCULAR | Status: AC | PRN
  Administered 2012-05-02: 100 mL via INTRAVENOUS

## 2012-05-02 NOTE — Progress Notes (Signed)
  Subjective:    Patient ID: Jennifer Mata, female    DOB: 17-Sep-1991, 21 y.o.   MRN: [id number]  HPI Pt presents to clinic for evaluation of abdominal pain. Notes chronic intermittent ruq pain that radiates posterior. S/p PPI and nl [place]. Underwent incomplete colonoscopy to splenic flexure. Has intermittent associated nausea without emesis. No alleviating or exacerbating factors. Attempted paxil for anxiety but stopped due to side effects.   No past medical history on file. Past Surgical History  Procedure Date  . No past surgeries 04/11/2012    reports that she has never smoked. She has never used smokeless tobacco. She reports that she drinks alcohol. She reports that she does not use illicit drugs. family history includes Diabetes in her father.  There is no history of Breast cancer, and Prostate cancer, and Heart disease, and Colon cancer, and Hypertension, . Allergies  Allergen Reactions  . Sulfonamide Derivatives     REACTION: unknown reaction     Review of Systems see hpi     Objective:   Physical Exam  Nursing note and vitals reviewed. Constitutional: She appears well-developed and well-nourished. No distress.  HENT:  Head: Normocephalic and atraumatic.  Abdominal: Soft. Bowel sounds are normal. She exhibits no distension and no mass. There is no tenderness. There is no rebound and no guarding.  Neurological: She is alert.  Skin: Skin is warm and dry. She is not diaphoretic.  Psychiatric: She has a normal mood and affect.          Assessment & Plan:

## 2012-05-08 NOTE — Assessment & Plan Note (Signed)
Proceed with abd CT. If unrevealing f/u with GI

## 2012-05-08 NOTE — Assessment & Plan Note (Signed)
Change paxil to lexapro.

## 2012-05-10 ENCOUNTER — Ambulatory Visit: Payer: 59 | Admitting: Internal Medicine

## 2012-06-03 ENCOUNTER — Ambulatory Visit: Payer: 59 | Admitting: Internal Medicine

## 2012-06-27 ENCOUNTER — Telehealth: Payer: Self-pay | Admitting: Internal Medicine

## 2012-06-27 MED ORDER — NORETHIN ACE-ETH ESTRAD-FE 1-20 MG-MCG PO TABS: 1.0000 | ORAL_TABLET | Freq: Every day | ORAL | Status: DC

## 2012-06-27 NOTE — Telephone Encounter (Signed)
Patient's mother called wants  [facility]  [facility]   Lissa Hoard, mother [name] wants at [employer] if we need to call her

## 2012-06-27 NOTE — Telephone Encounter (Signed)
Rx done/SLS 

## 2012-09-28 ENCOUNTER — Telehealth: Payer: Self-pay | Admitting: Internal Medicine

## 2012-09-28 NOTE — Telephone Encounter (Signed)
gildess FE 1/20 tabs 28's. 90 day supply. Refills 4

## 2012-09-29 NOTE — Telephone Encounter (Signed)
LMOM with contact name & number for return call RE: Rx request [last BC filled Junel FE 1/20 08.05.13 #[id number]]/SLS

## 2012-09-30 MED ORDER — NORETHIN ACE-ETH ESTRAD-FE 1-20 MG-MCG PO TABS: 1.0000 | ORAL_TABLET | Freq: Every day | ORAL | Status: DC

## 2012-09-30 NOTE — Telephone Encounter (Signed)
Patient confirmed via phone message that correct [facility] Rx is: Junel FE; Rx to mail order pharmacy as requested/SLS

## 2012-10-20 IMAGING — US US ABDOMEN COMPLETE
1 series · 14 of 25 positions shown · non-contrast
Comparison: None.

CLINICAL DATA: Right upper quadrant tenderness and dull pain and
chronic intermittent nausea.

COMPLETE ABDOMINAL ULTRASOUND

[Series 1: us abdomen complete · 0.28mm/px · 14 of 71 slices shown]
[im 1/71]
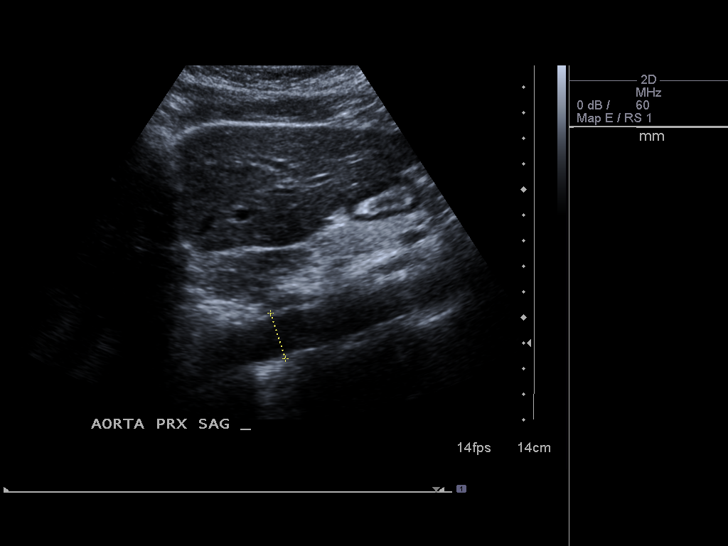
[im 6/71]
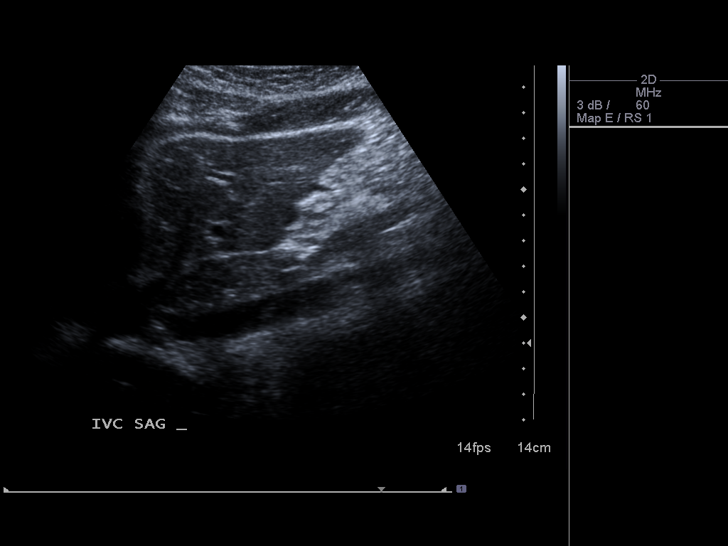
[im 12/71]
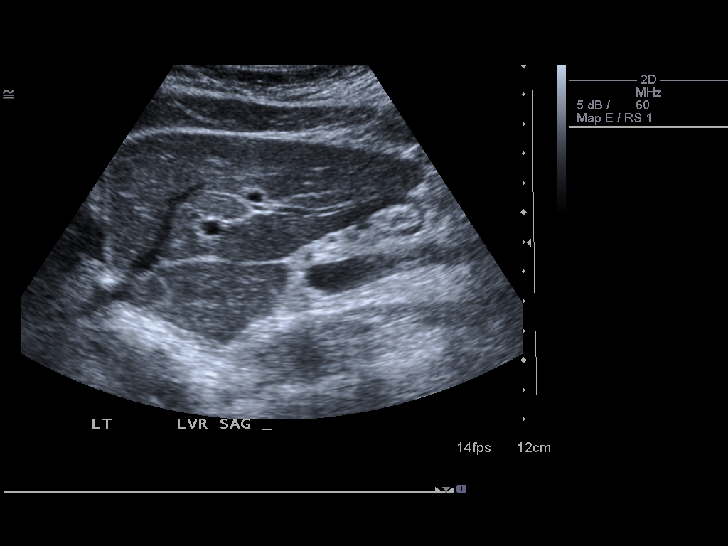
[im 18/71]
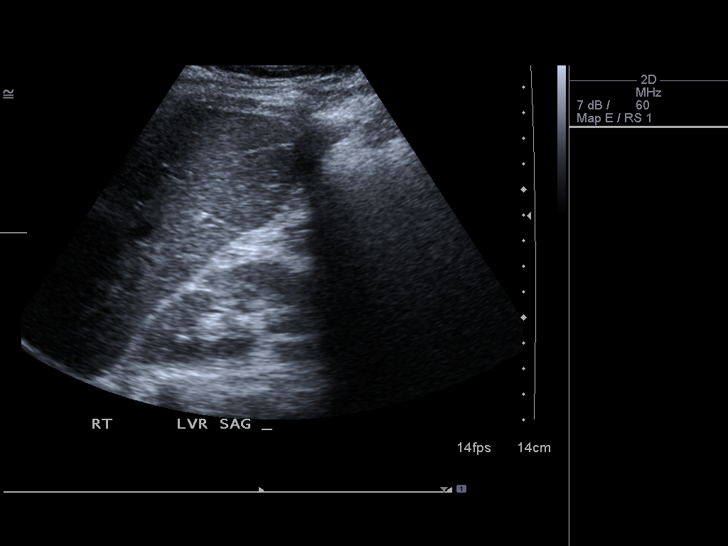
[im 24/71]
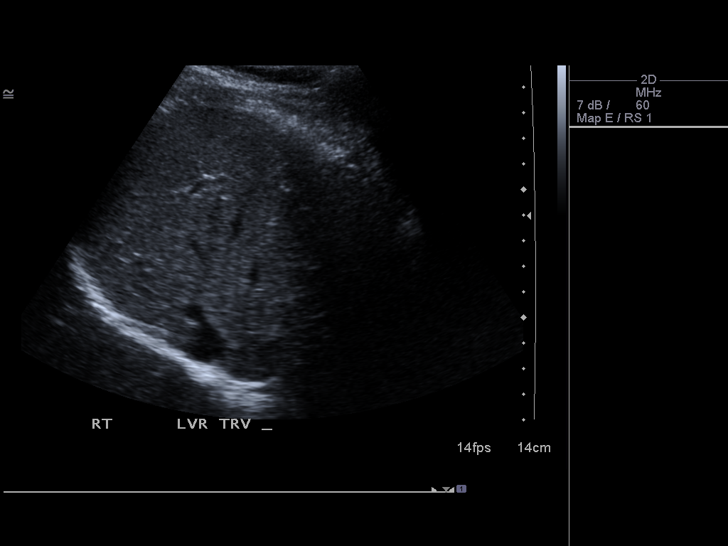
[im 27/71]
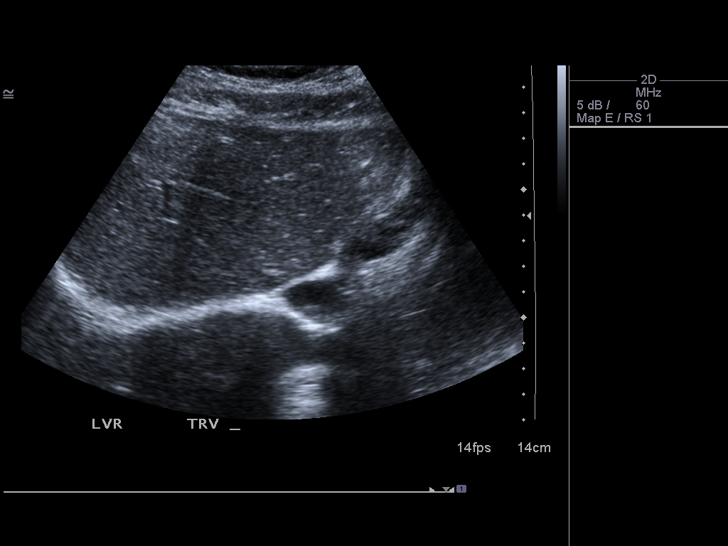
[im 33/71]
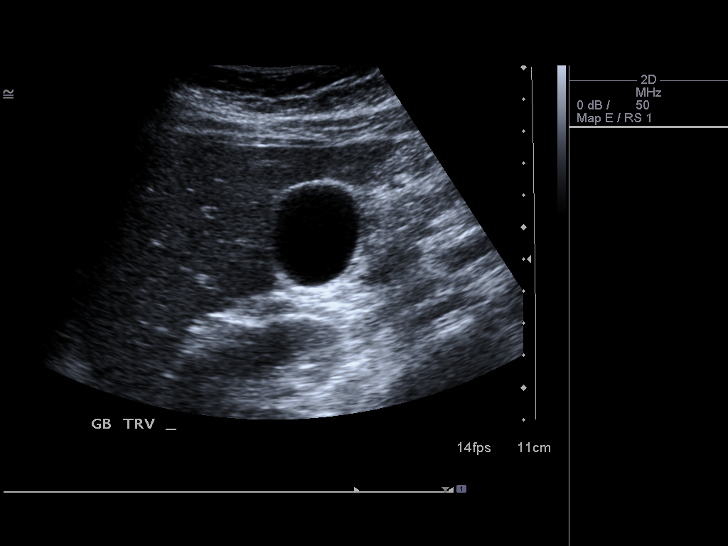
[im 38/71]
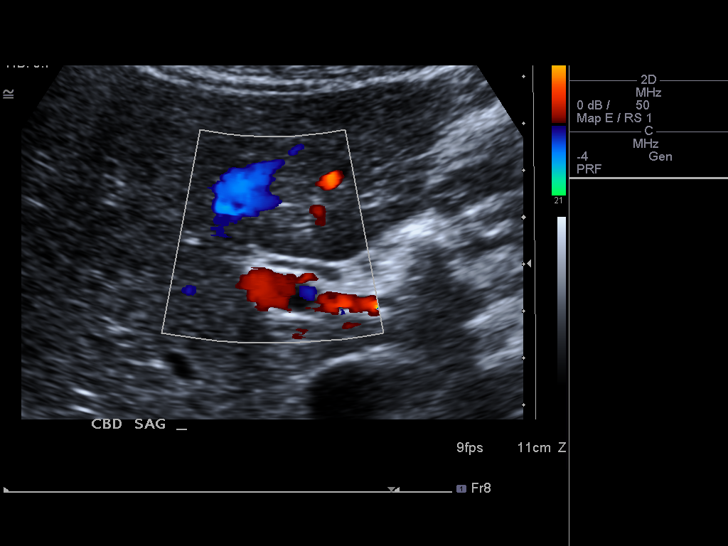
[im 44/71]
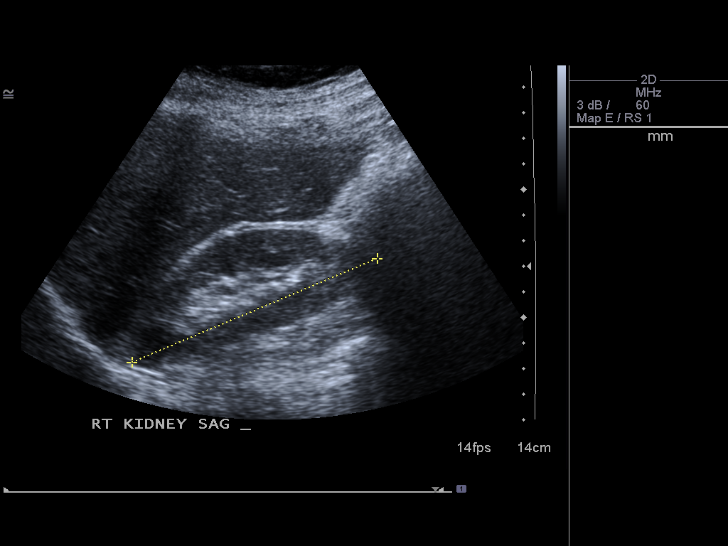
[im 47/71]
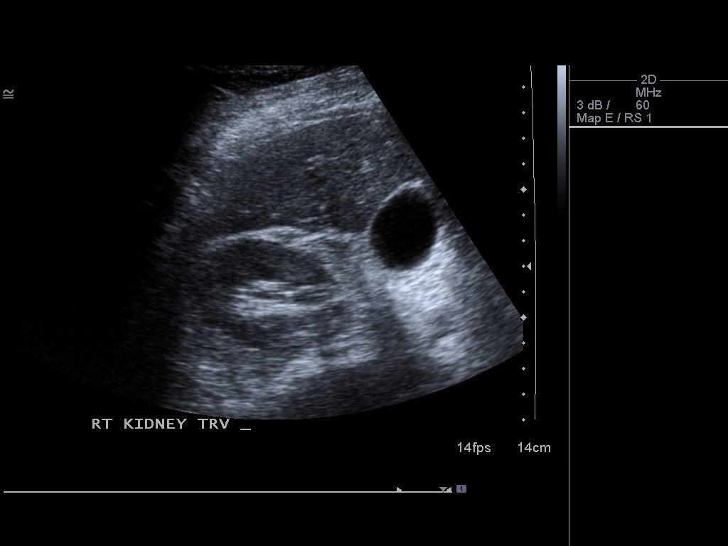
[im 53/71]
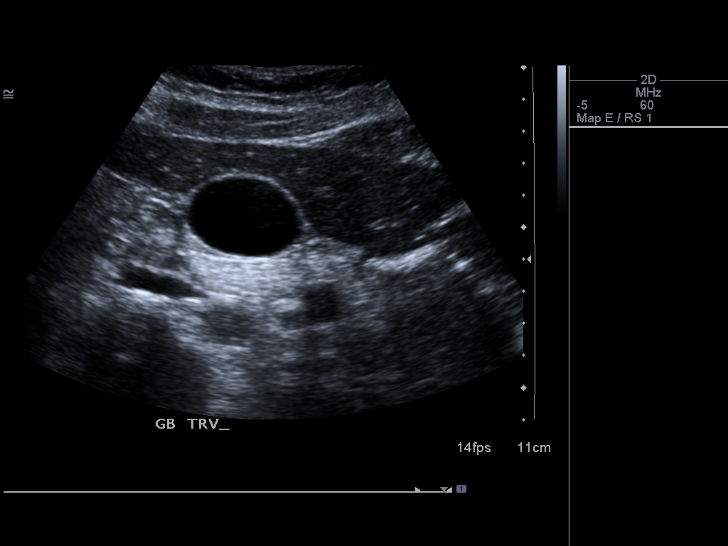
[im 59/71]
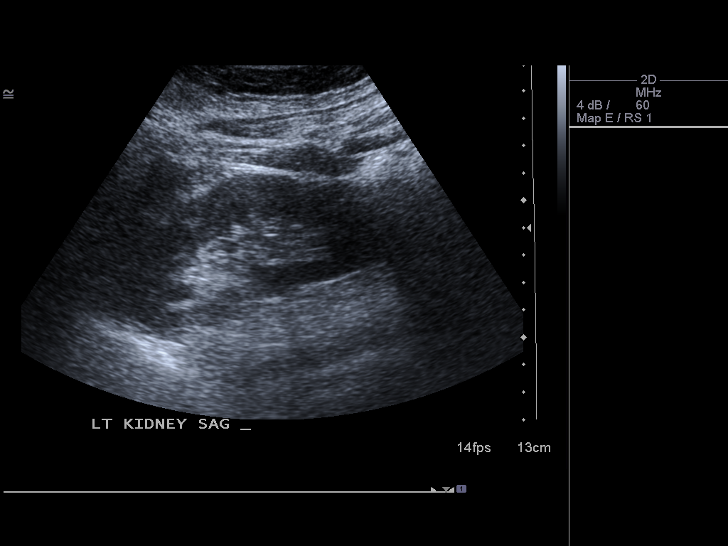
[im 65/71]
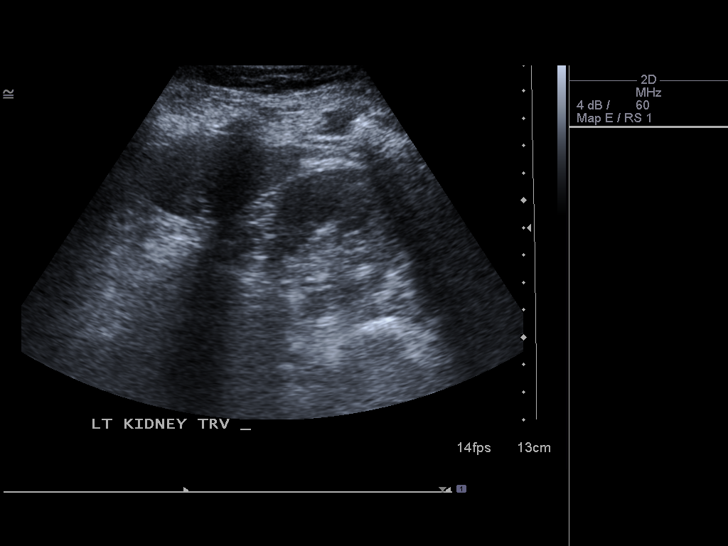
[im 71/71]
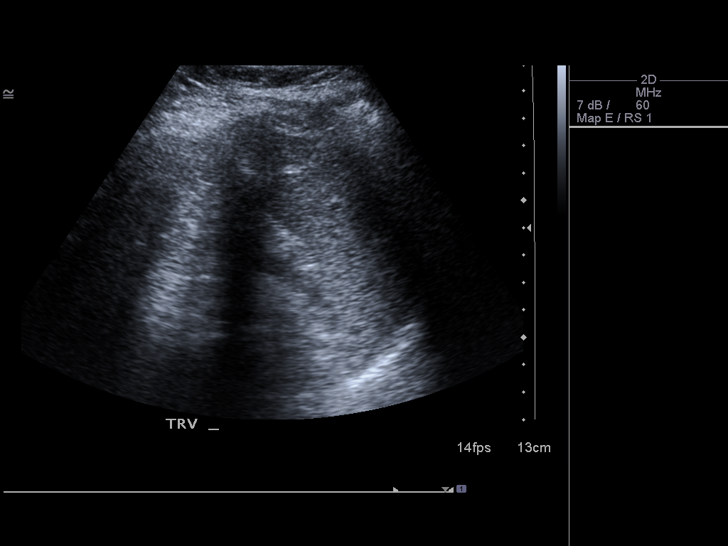

[14 of 25 positions shown; findings below may reference images not displayed]

FINDINGS: Gallbladder:  No gallstones, gallbladder wall thickening, or
pericholecystic fluid.

Common bile duct:  Normal.  2 mm in diameter.

Liver:  Normal.

IVC:  Partially obscured by bowel gas.  The visualized portion is
normal.

Pancreas:  Normal.

Spleen:  Normal.  10.6 cm in length.

Right Kidney:  Normal.  10.4 cm in length.

Left Kidney:  Normal.  10.4 cm in length.

Abdominal aorta:  Normal.  1.9 cm in length.  Partially obscured by
bowel gas.
IMPRESSION: Negative abdominal ultrasound.

## 2013-01-04 ENCOUNTER — Telehealth: Payer: Self-pay | Admitting: *Deleted

## 2013-01-04 MED ORDER — NORETHIN ACE-ETH ESTRAD-FE 1-20 MG-MCG PO TABS: 1.0000 | ORAL_TABLET | Freq: Every day | ORAL | Status: DC

## 2013-01-04 NOTE — Telephone Encounter (Signed)
Pt's mom called requesting refill of pt's [facility] [address] Rx sent. Detailed message left on voicemail.

## 2013-12-04 ENCOUNTER — Other Ambulatory Visit: Payer: Self-pay | Admitting: Family

## 2013-12-04 NOTE — Telephone Encounter (Signed)
Pt was last seen in June 2013 and is requesting refill of birth control medication. Pt currently in college, please advise.

## 2013-12-06 NOTE — Telephone Encounter (Signed)
Can send 1 month supply but needs office visit prior to additional refills.  If she cannot be seen here, she may be able to go to her college clinic.

## 2013-12-08 NOTE — Telephone Encounter (Signed)
Rx sent.  Please call pt or her mom to advise re: need for follow up here or at her college to obtain further refills.

## 2013-12-08 NOTE — Telephone Encounter (Signed)
Left message for patient to return my call.

## 2013-12-11 NOTE — Telephone Encounter (Signed)
Spoke to patient mom, Camelia Engerri and informed her of medication refill. Camelia Engerri states that most likely patient will go to college Clinic.

## 2014-04-04 IMAGING — CT CT ABDOMEN W/ CM
2 of 3 series · 18 of 46 positions shown, 20 images · IV contrast (omnipaque)
Comparison: Abdominal ultrasound 11/18/2010

CLINICAL DATA: Right upper quadrant abdominal pain, nausea

CT ABDOMEN WITH CONTRAST
TECHNIQUE: Multidetector CT imaging of the abdomen was performed
using the standard protocol following bolus administration of
intravenous contrast.
Contrast: 100mL OMNIPAQUE IOHEXOL 300 MG/ML  SOLN

[Series 2: abd/pelvis 5.0 b31f · axial · 0.64mm/px · z∈[+1043,+1283]mm · 15 of 54 slices shown, 17 images]
[im 4/54  soft-tissue]
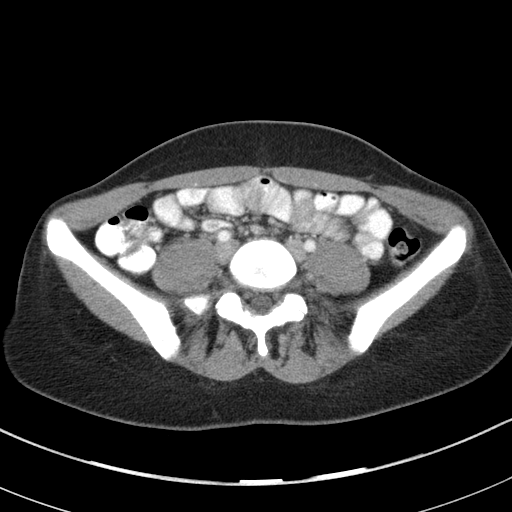
[im 4/54  bone]
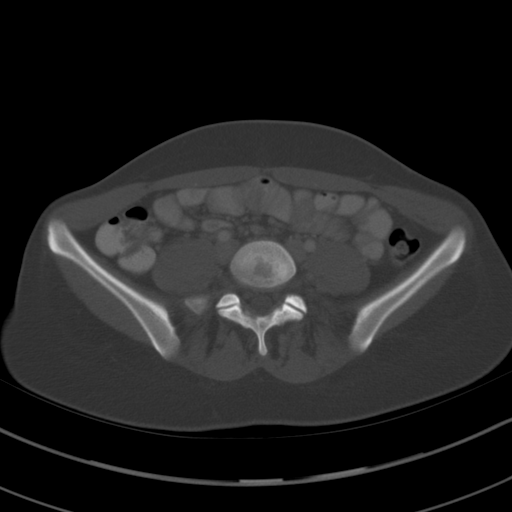
[im 7/54  soft-tissue]
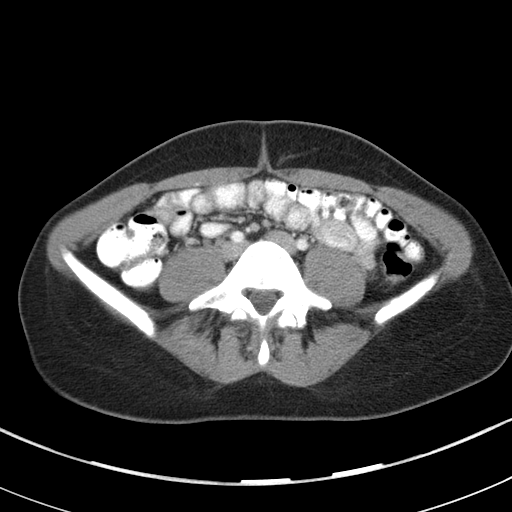
[im 11/54  soft-tissue]
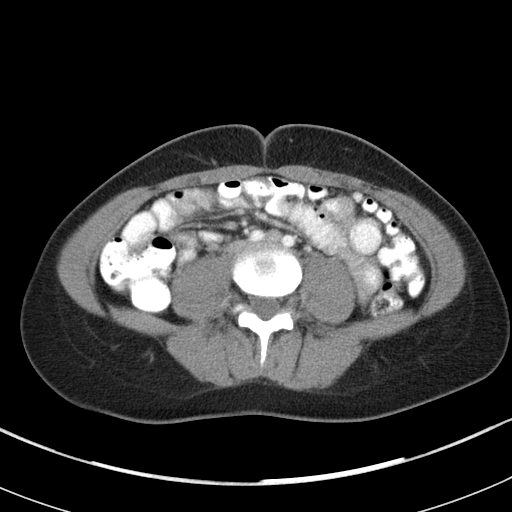
[im 14/54  soft-tissue]
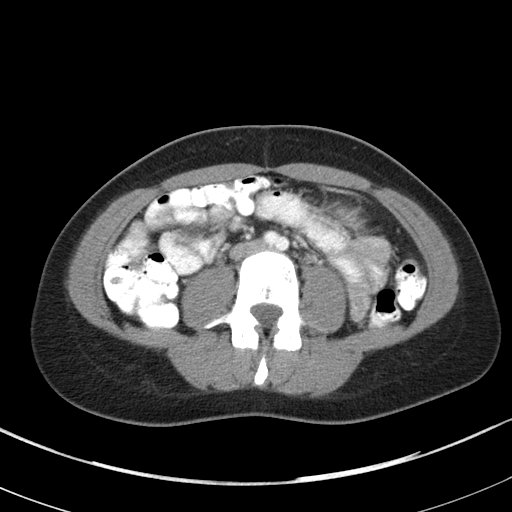
[im 18/54  soft-tissue]
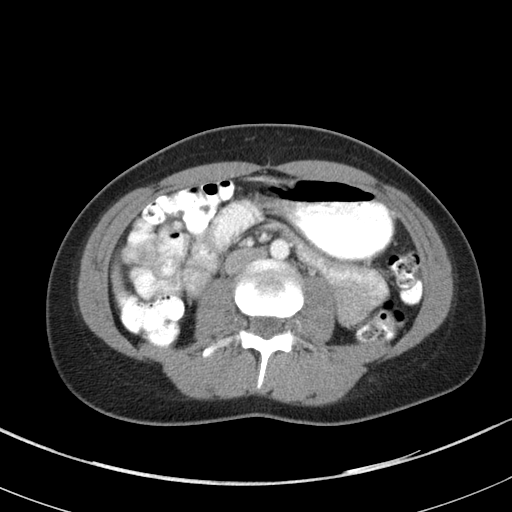
[im 21/54  soft-tissue]
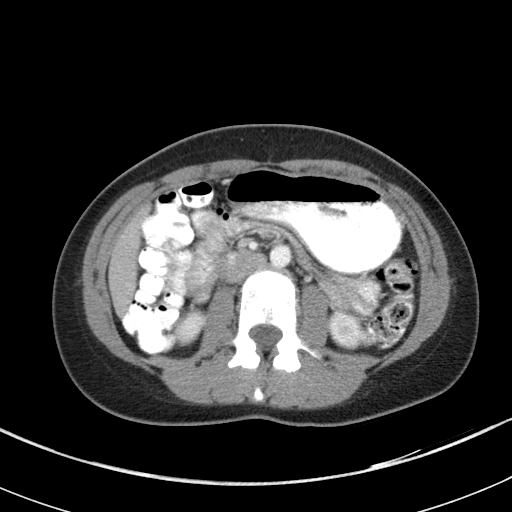
[im 24/54  soft-tissue]
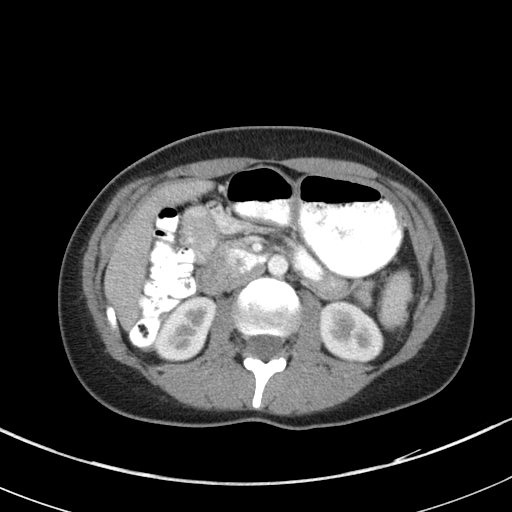
[im 28/54  soft-tissue]
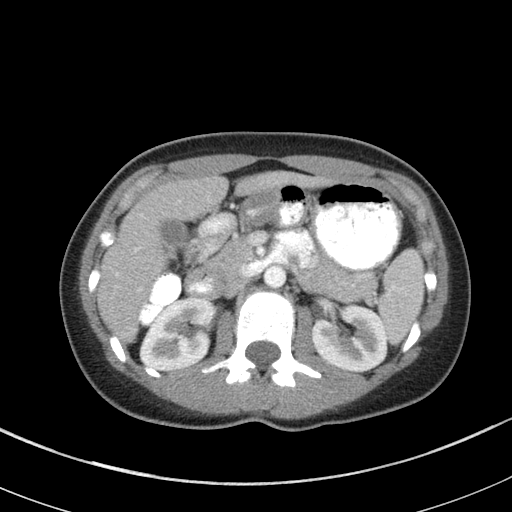
[im 31/54  soft-tissue]
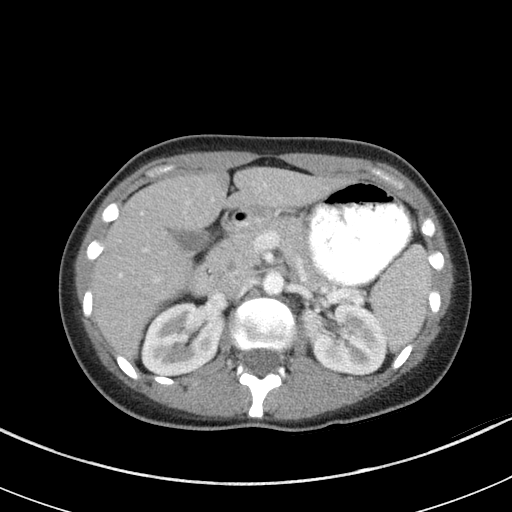
[im 31/54  bone]
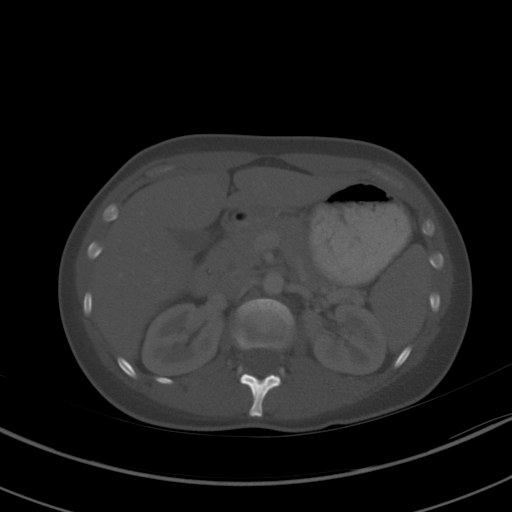
[im 35/54  soft-tissue]
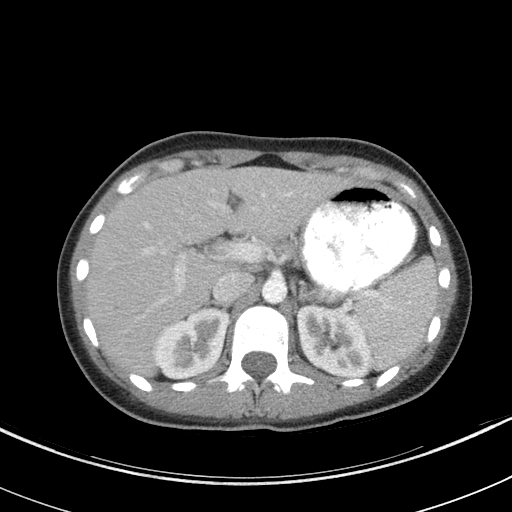
[im 38/54  soft-tissue]
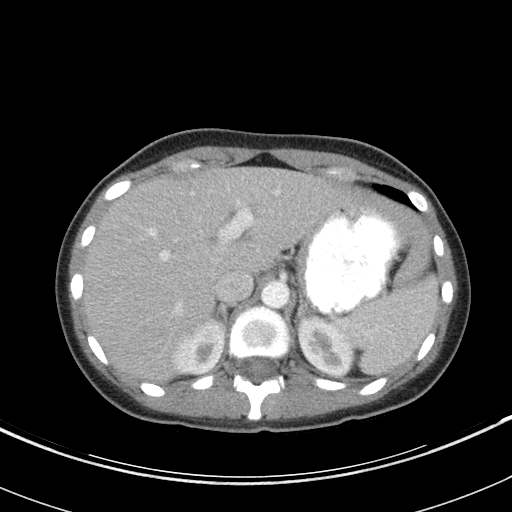
[im 42/54  soft-tissue]
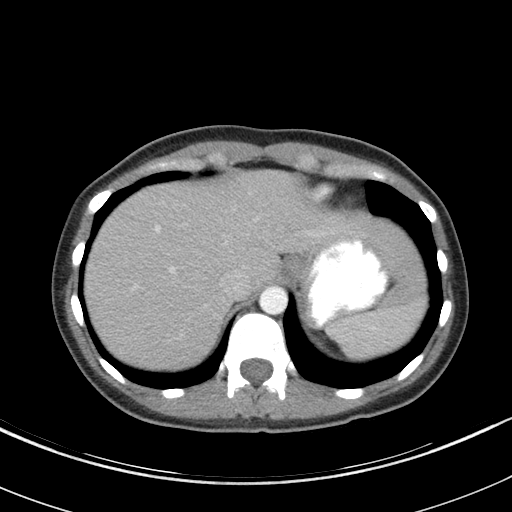
[im 45/54  soft-tissue]
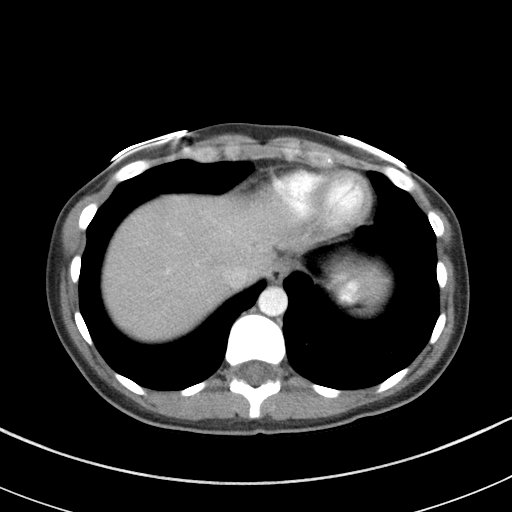
[im 48/54  soft-tissue]
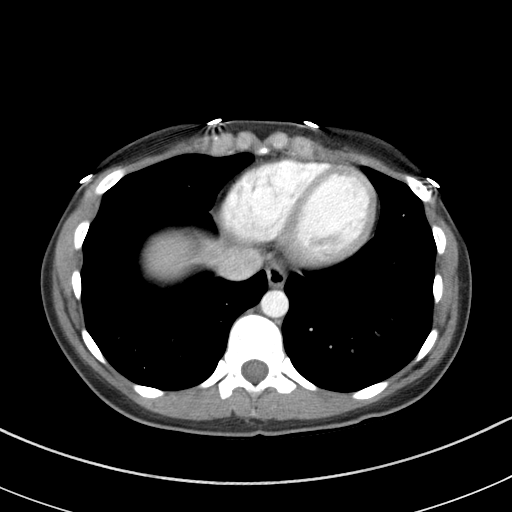
[im 52/54  soft-tissue]
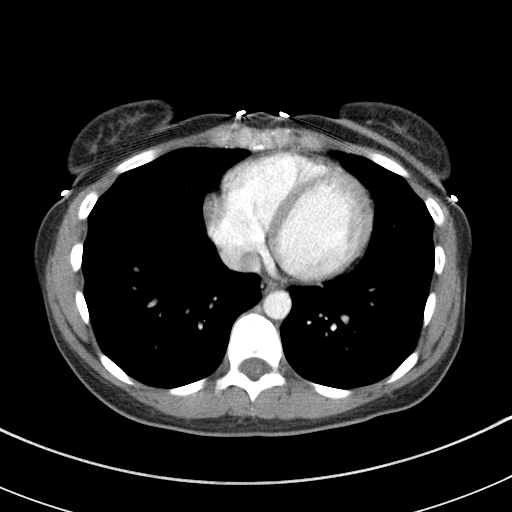

[Series 5: abd/pelvis 3.0 coronal · coronal · 0.54mm/px · 3 of 68 slices shown]
[im 23/68  soft-tissue]
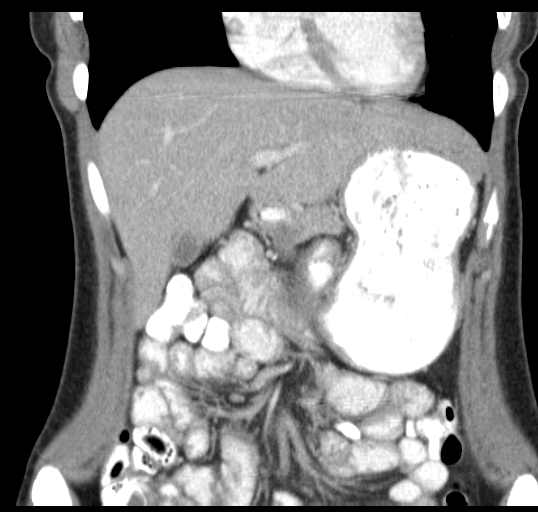
[im 30/68  soft-tissue]
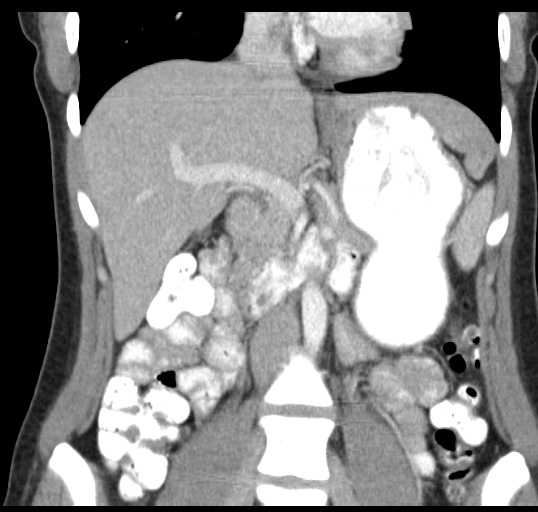
[im 38/68  soft-tissue]
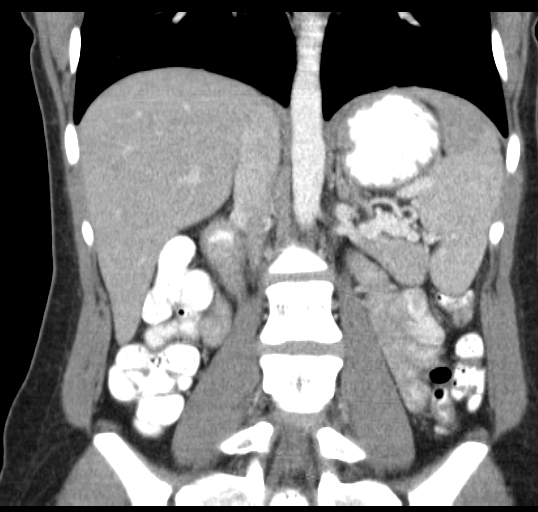

[18 of 46 positions shown; findings below may reference images not displayed]

FINDINGS: Lung bases are clear.

Abdominal viscera are normal.  No free air, fluid, or
lymphadenopathy.  Ligament of Treitz properly located.  No
visualized bowel wall thickening.  No acute osseous abnormality.
IMPRESSION: Normal exam.

## 2014-04-27 ENCOUNTER — Telehealth: Payer: Self-pay | Admitting: *Deleted

## 2014-04-27 NOTE — Telephone Encounter (Signed)
Received call from pt requesting refill of birth control and wants records sent to new Provider in [place] as it is closer to her school. Spoke with pt. Advised her we are unable to do further refills per 11/2013 phone note and she will need to get further refills from new PCP. She voices understanding and states she has appt with them on Monday and would like [place] to send her office note and pap smear result to [facility] at [phone]. Sent 2011 office note and pap smear result and advised pt she will need to sign records release at new PCP to obtain further records and she voices understanding.

## 2016-07-22 NOTE — Progress Notes (Signed)
Erroneous encounter
